# Patient Record
Sex: Male | Born: 1955 | Race: Black or African American | Hispanic: No | Smoking: Current every day smoker
Health system: Southern US, Community
[De-identification: ages and names within clinical notes are randomized; demographics above are authoritative.]

## PROBLEM LIST (undated history)

## (undated) DIAGNOSIS — I10 Essential (primary) hypertension: Secondary | ICD-10-CM

## (undated) DIAGNOSIS — F209 Schizophrenia, unspecified: Secondary | ICD-10-CM

---

## 2015-12-23 ENCOUNTER — Emergency Department
Admission: EM | Admit: 2015-12-23 | Discharge: 2015-12-23 | Disposition: A | Discharge status: Home / Self Care | Payer: Self-pay | Attending: Emergency Medicine | Admitting: Emergency Medicine

## 2015-12-23 ENCOUNTER — Emergency Department: Payer: Self-pay

## 2015-12-23 ENCOUNTER — Encounter: Payer: Self-pay | Admitting: Emergency Medicine

## 2015-12-23 DIAGNOSIS — I10 Essential (primary) hypertension: Secondary | ICD-10-CM | POA: Insufficient documentation

## 2015-12-23 DIAGNOSIS — R51 Headache: Secondary | ICD-10-CM

## 2015-12-23 DIAGNOSIS — F209 Schizophrenia, unspecified: Secondary | ICD-10-CM | POA: Insufficient documentation

## 2015-12-23 DIAGNOSIS — R519 Headache, unspecified: Secondary | ICD-10-CM

## 2015-12-23 DIAGNOSIS — F17210 Nicotine dependence, cigarettes, uncomplicated: Secondary | ICD-10-CM | POA: Insufficient documentation

## 2015-12-23 HISTORY — DX: Essential (primary) hypertension: I10

## 2015-12-23 HISTORY — DX: Schizophrenia, unspecified: F20.9

## 2015-12-23 MED ORDER — HYDRALAZINE HCL 20 MG/ML IJ SOLN
10.0000 mg | Freq: Once | INTRAMUSCULAR | Status: AC
  Administered 2015-12-23: 10 mg via INTRAVENOUS
  Filled 2015-12-23: qty 1

## 2015-12-23 MED ORDER — KETOROLAC TROMETHAMINE 30 MG/ML IJ SOLN
10.0000 mg | Freq: Once | INTRAMUSCULAR | Status: AC
  Administered 2015-12-23: 9.9 mg via INTRAVENOUS
  Filled 2015-12-23: qty 1

## 2015-12-23 NOTE — ED Notes (Signed)
Sherilyn CooterHenry RN called Jeri Lagerony Miles [phone] owner of [facility] to come and pick up the Pt. Mr. Rosary LivelyMile states that he would not be able to come and pickup the Pt until 08:00AM. Charge nurse was notified.

## 2015-12-23 NOTE — ED Notes (Signed)
Pt given discharge papers and told the group home owner was on the way to pick him up. Pt walked out before signature obtained. NAD at the time discharge papers reviewed.

## 2015-12-23 NOTE — Discharge Instructions (Signed)
1. Make sure to take your medicines day and night as directed by your doctor. 2. Return to the ER for worsening symptoms, persistent vomiting, difficulty breathing or other concerns.  General Headache Without Cause A headache is pain or discomfort felt around the head or neck area. There are many causes and types of headaches. In some cases, the cause may not be found.  HOME CARE  Managing Pain  Take over-the-counter and prescription medicines only as told by your doctor.  Lie down in a dark, quiet room when you have a headache.  If directed, apply ice to the head and neck area:  Put ice in a plastic bag.  Place a towel between your skin and the bag.  Leave the ice on for 20 minutes, 2-3 times per day.  Use a heating pad or hot shower to apply heat to the head and neck area as told by your doctor.  Keep lights dim if bright lights bother you or make your headaches worse. Eating and Drinking  Eat meals on a regular schedule.  Lessen how much alcohol you drink.  Lessen how much caffeine you drink, or stop drinking caffeine. General Instructions  Keep all follow-up visits as told by your doctor. This is important.  Keep a journal to find out if certain things bring on headaches. For example, write down:  What you eat and drink.  How much sleep you get.  Any change to your diet or medicines.  Relax by getting a massage or doing other relaxing activities.  Lessen stress.  Sit up straight. Do not tighten (tense) your muscles.  Do not use tobacco products. This includes cigarettes, chewing tobacco, or e-cigarettes. If you need help quitting, ask your doctor.  Exercise regularly as told by your doctor.  Get enough sleep. This often means 7-9 hours of sleep. GET HELP IF:  Your symptoms are not helped by medicine.  You have a headache that feels different than the other headaches.  You feel sick to your stomach (nauseous) or you throw up (vomit).  You have a  fever. GET HELP RIGHT AWAY IF:   Your headache becomes really bad.  You keep throwing up.  You have a stiff neck.  You have trouble seeing.  You have trouble speaking.  You have pain in the eye or ear.  Your muscles are weak or you lose muscle control.  You lose your balance or have trouble walking.  You feel like you will pass out (faint) or you pass out.  You have confusion.   This information is not intended to replace advice given to you by your health care provider. Make sure you discuss any questions you have with your health care provider.   Document Released: 04/05/2008 Document Revised: 03/18/2015 Document Reviewed: 10/20/2014 Elsevier Interactive Patient Education 2016 [employer]  Hypertension Hypertension is another name for high blood pressure. High blood pressure forces your heart to work harder to pump blood. A blood pressure reading has two numbers, which includes a higher number over a lower number (example: 110/72). HOME CARE   Have your blood pressure rechecked by your doctor.  Only take medicine as told by your doctor. Follow the directions carefully. The medicine does not work as well if you skip doses. Skipping doses also puts you at risk for problems.  Do not smoke.  Monitor your blood pressure at home as told by your doctor. GET HELP IF:  You think you are having a reaction to the medicine  you are taking.  You have repeat headaches or feel dizzy.  You have puffiness (swelling) in your ankles.  You have trouble with your vision. GET HELP RIGHT AWAY IF:   You get a very bad headache and are confused.  You feel weak, numb, or faint.  You get chest or belly (abdominal) pain.  You throw up (vomit).  You cannot breathe very well. MAKE SURE YOU:   Understand these instructions.  Will watch your condition.  Will get help right away if you are not doing well or get worse.   This information is not intended to replace advice given to  you by your health care provider. Make sure you discuss any questions you have with your health care provider.   Document Released: 12/14/2007 Document Revised: 07/02/2013 Document Reviewed: 04/19/2013 Elsevier Interactive Patient Education [employer]
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
## 2017-02-16 IMAGING — CT CT HEAD W/O CM
3 of 4 series · 16 of 47 positions shown, 19 images · non-contrast
Comparison: None.

CLINICAL DATA: Acute onset of generalized headache. Wound on the
head. Initial encounter.

EXAM:
CT HEAD WITHOUT CONTRAST
TECHNIQUE: Contiguous axial images were obtained from the base of the skull
through the vertex without intravenous contrast.

[Series 2: head wo · axial · 0.44mm/px · z∈[-18,+118]mm · 10 of 33 slices shown, 13 images]
[im 3/33  brain]
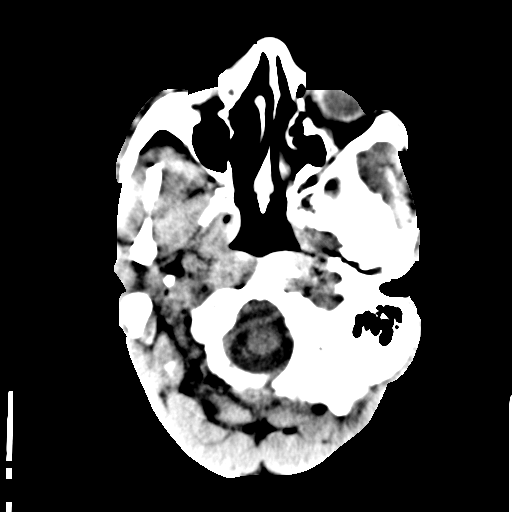
[im 3/33  bone]
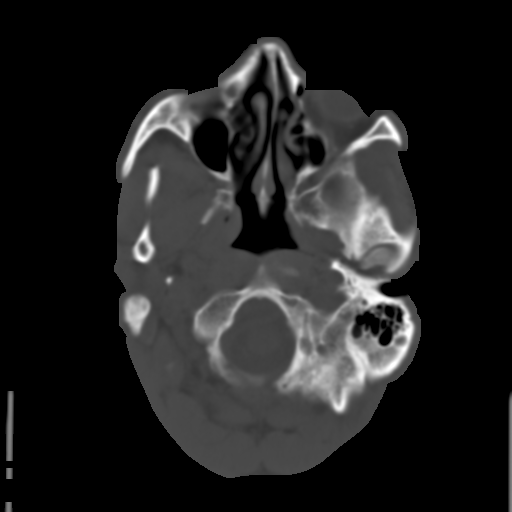
[im 5/33  brain]
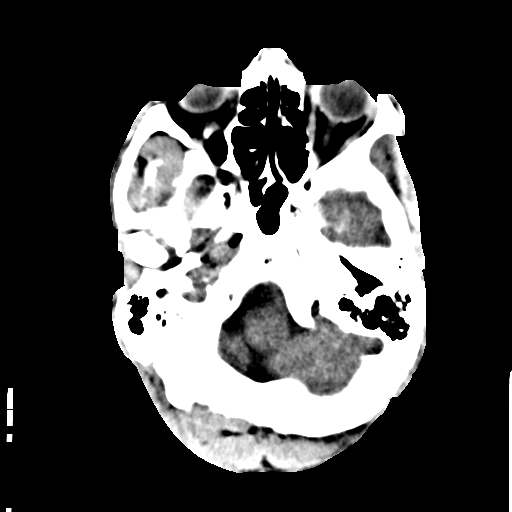
[im 10/33  brain]
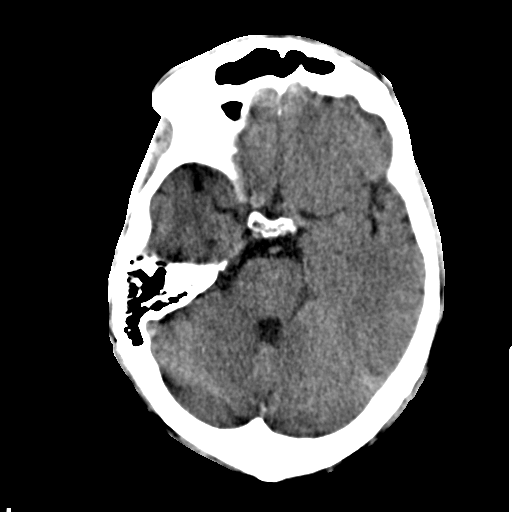
[im 12/33  brain]
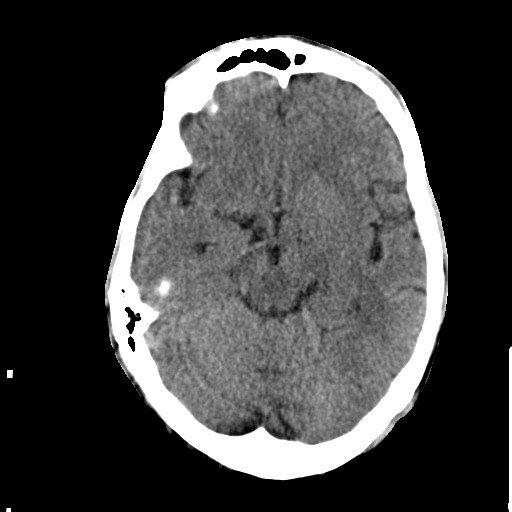
[im 14/33  brain]
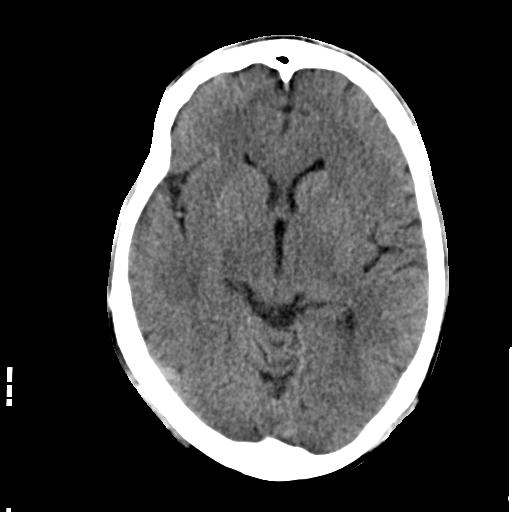
[im 14/33  bone]
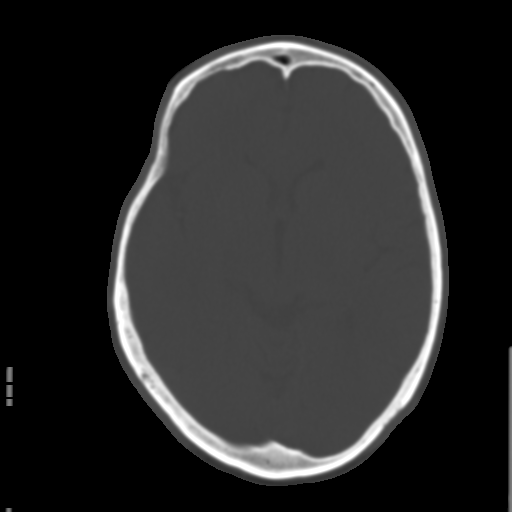
[im 19/33  brain]
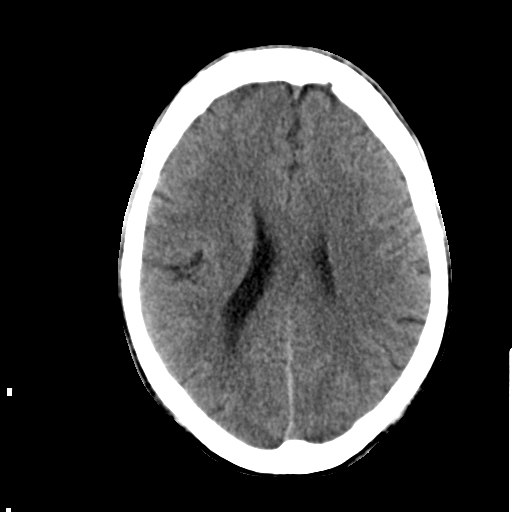
[im 21/33  brain]
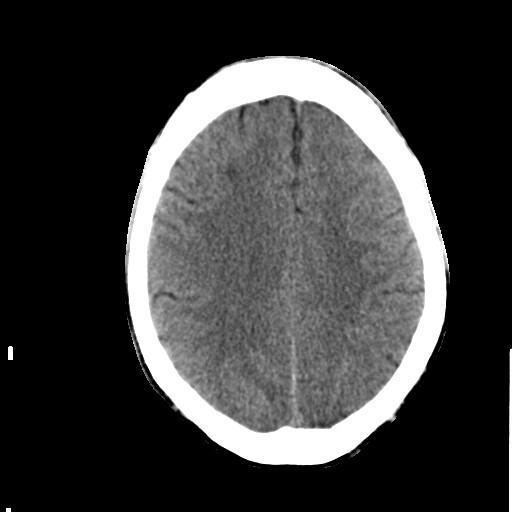
[im 23/33  brain]
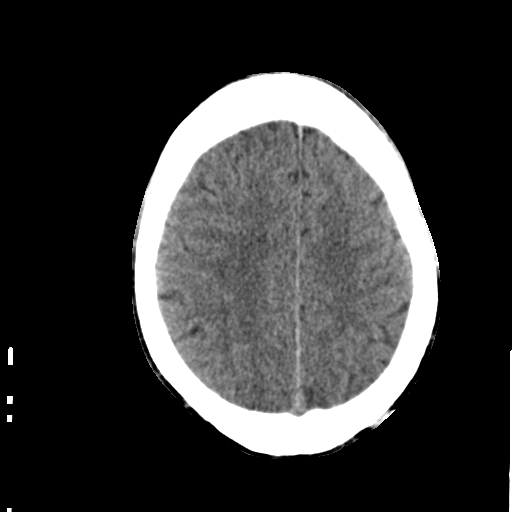
[im 28/33  brain]
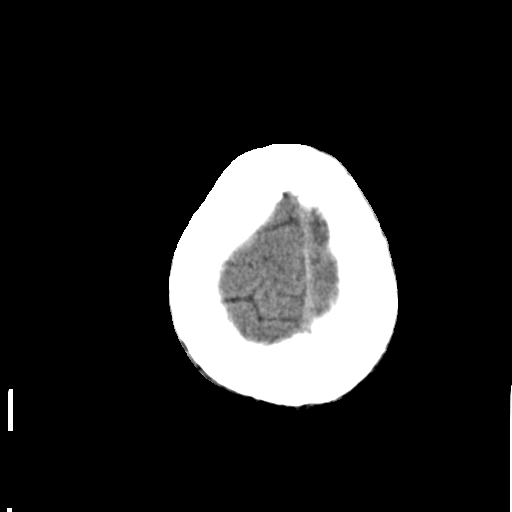
[im 28/33  bone]
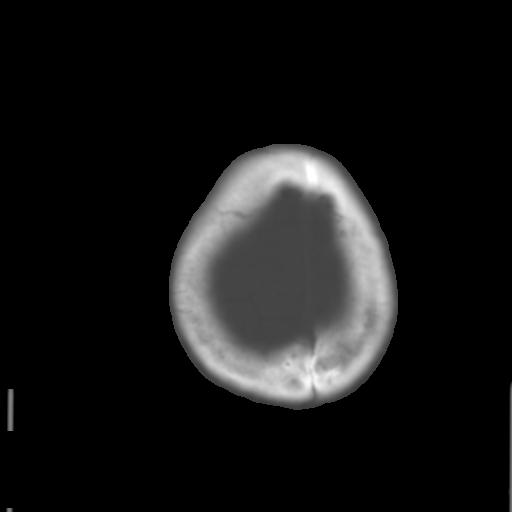
[im 30/33  brain]
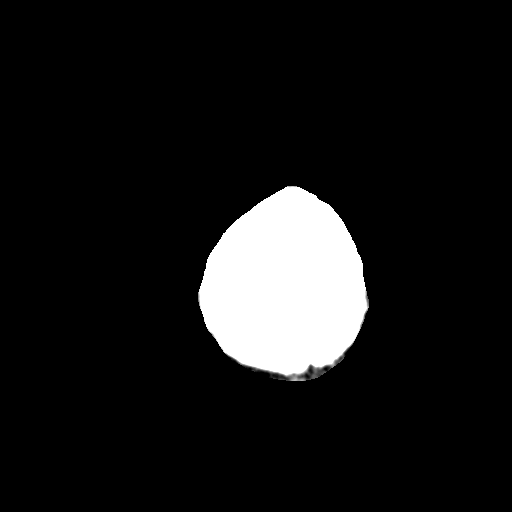

[Series 6: coronal soft tissue · coronal · 0.38mm/px · 3 of 69 slices shown]
[im 23/69  brain]
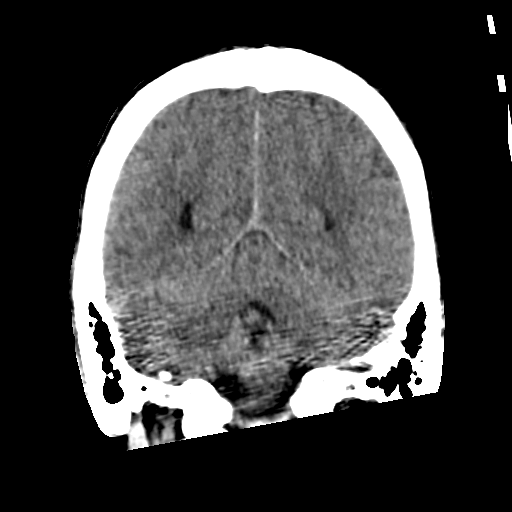
[im 31/69  brain]
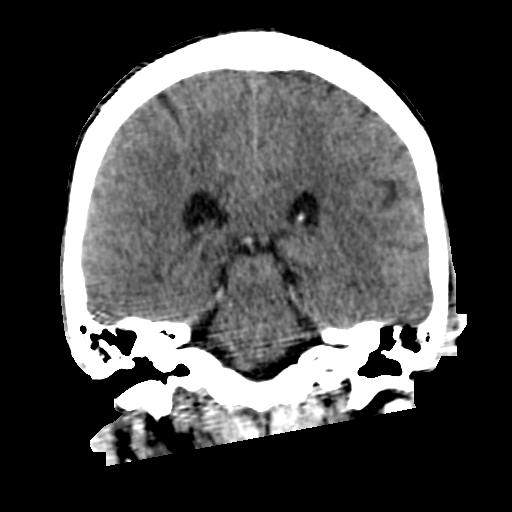
[im 38/69  brain]
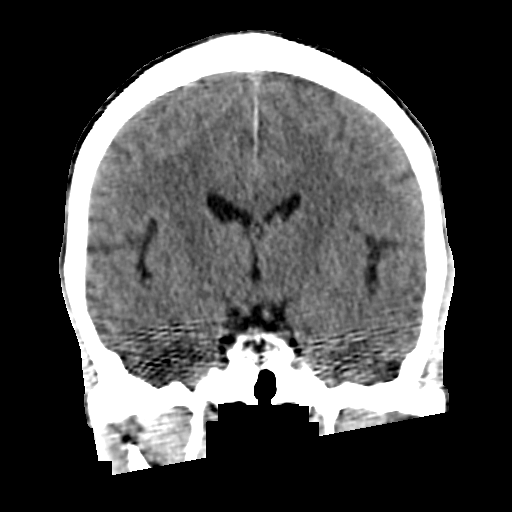

[Series 7: sagittal soft tissue · sagittal · 0.37mm/px · 3 of 54 slices shown]
[im 18/54  brain]
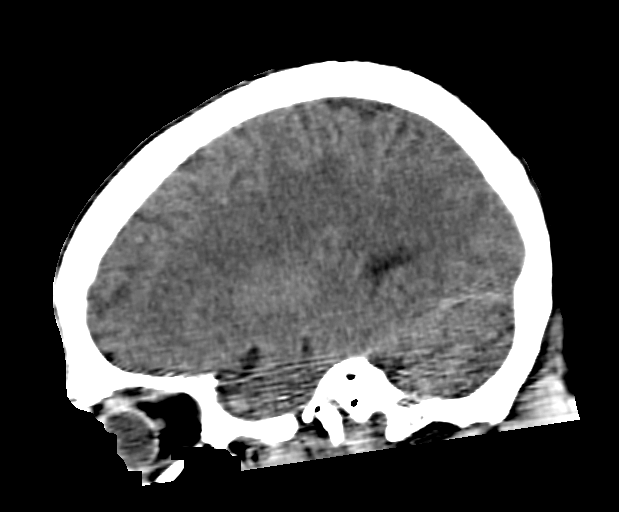
[im 27/54  brain]
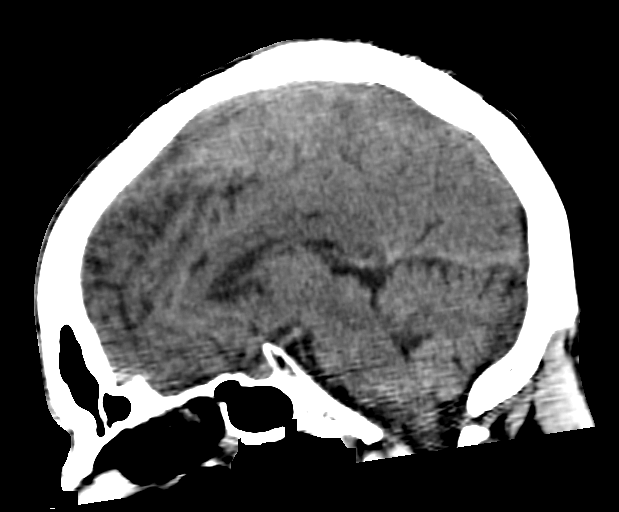
[im 36/54  brain]
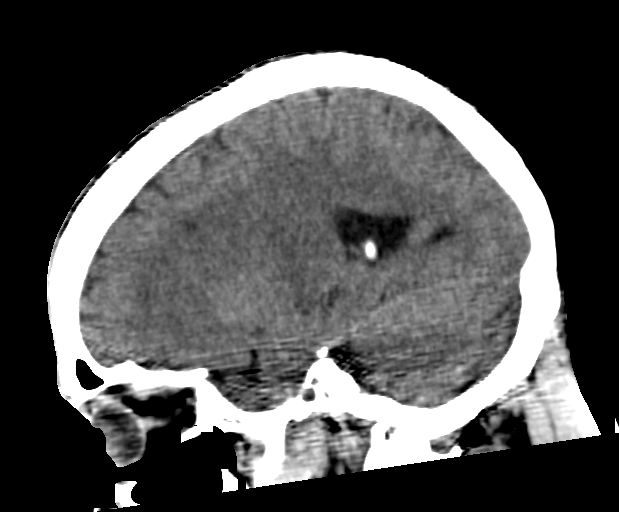

[16 of 47 positions shown; findings below may reference images not displayed]

FINDINGS: There is no evidence of acute infarction, mass lesion, or intra- or
extra-axial hemorrhage on CT.

The posterior fossa, including the cerebellum, brainstem and fourth
ventricle, is within normal limits. The third and lateral
ventricles, and basal ganglia are unremarkable in appearance. The
cerebral hemispheres are symmetric in appearance, with normal
gray-white differentiation. No mass effect or midline shift is seen.

There is no evidence of fracture; visualized osseous structures are
unremarkable in appearance. The orbits are within normal limits. The
paranasal sinuses and mastoid air cells are well-aerated. Minimal
soft tissue calcification is noted overlying the left posterior
parietal calvarium.
IMPRESSION: Unremarkable noncontrast CT of the head.
